# Patient Record
Sex: Male | Born: 1965 | Race: White | Hispanic: No | Marital: Married | Smoking: Never smoker
Health system: Southern US, Community
[De-identification: ages and names within clinical notes are randomized; demographics above are authoritative.]

## PROBLEM LIST (undated history)

## (undated) DIAGNOSIS — Z87442 Personal history of urinary calculi: Secondary | ICD-10-CM

---

## 2006-07-03 ENCOUNTER — Emergency Department (HOSPITAL_COMMUNITY): Admission: EM | Admit: 2006-07-03 | Discharge: 2006-07-03 | Payer: Self-pay | Admitting: Emergency Medicine

## 2009-10-26 ENCOUNTER — Emergency Department (HOSPITAL_COMMUNITY): Admission: EM | Admit: 2009-10-26 | Discharge: 2009-10-27 | Payer: Self-pay | Admitting: Emergency Medicine

## 2011-02-10 ENCOUNTER — Ambulatory Visit (HOSPITAL_COMMUNITY)
Admission: RE | Admit: 2011-02-10 | Discharge: 2011-02-10 | Disposition: A | Discharge status: Home / Self Care | Payer: BC Managed Care – PPO | Source: Ambulatory Visit | Attending: Family Medicine | Admitting: Family Medicine

## 2011-02-10 ENCOUNTER — Other Ambulatory Visit: Payer: Self-pay | Admitting: Family Medicine

## 2011-02-10 DIAGNOSIS — R1031 Right lower quadrant pain: Secondary | ICD-10-CM | POA: Insufficient documentation

## 2011-02-10 DIAGNOSIS — N200 Calculus of kidney: Secondary | ICD-10-CM

## 2012-07-13 ENCOUNTER — Ambulatory Visit (INDEPENDENT_AMBULATORY_CARE_PROVIDER_SITE_OTHER): Payer: BC Managed Care – PPO | Admitting: Nurse Practitioner

## 2012-07-13 ENCOUNTER — Encounter: Payer: Self-pay | Admitting: Nurse Practitioner

## 2012-07-13 VITALS — BP 122/78 | Temp 98.4°F | Wt 203.8 lb

## 2012-07-13 DIAGNOSIS — J321 Chronic frontal sinusitis: Secondary | ICD-10-CM

## 2012-07-13 MED ORDER — AZITHROMYCIN 250 MG PO TABS: ORAL_TABLET | ORAL | Status: AC

## 2012-07-13 NOTE — Progress Notes (Signed)
Subjective:  Presents for complaints of occasional cough and frontal area headache for the past several days. Clear nasal drainage. Slight sore throat. No ear pain. No wheezing.  Objective:   BP 122/78  Temp(Src) 98.4 F (36.9 C) (Oral)  Wt 203 lb 12.8 oz (92.443 kg) NAD. Alert, oriented. TMs clear effusion, no erythema. Pharynx erythematous with green PND noted. Neck supple mild soft nontender adenopathy. Lungs clear. Heart regular rate rhythm.

## 2012-07-13 NOTE — Assessment & Plan Note (Addendum)
Meds ordered this encounter  Medications  . azithromycin (ZITHROMAX Z-PAK) 250 MG tablet    Sig: Take 2 tablets (500 mg) on  Day 1,  followed by 1 tablet (250 mg) once daily on Days 2 through 5.    Dispense:  6 each    Refill:  0    Order Specific Question:  Supervising Provider    Answer:  Merlyn Albert [[id number]]  Return if symptoms worsen or fail to improve.

## 2013-10-21 IMAGING — CR DG ABDOMEN 1V
2 series · 2 of 2 positions shown · non-contrast
Comparison: None.

CLINICAL DATA: Kidney stone.  Right-sided flank pain.

ABDOMEN - 1 VIEW

[view not recorded (1 of 2)]
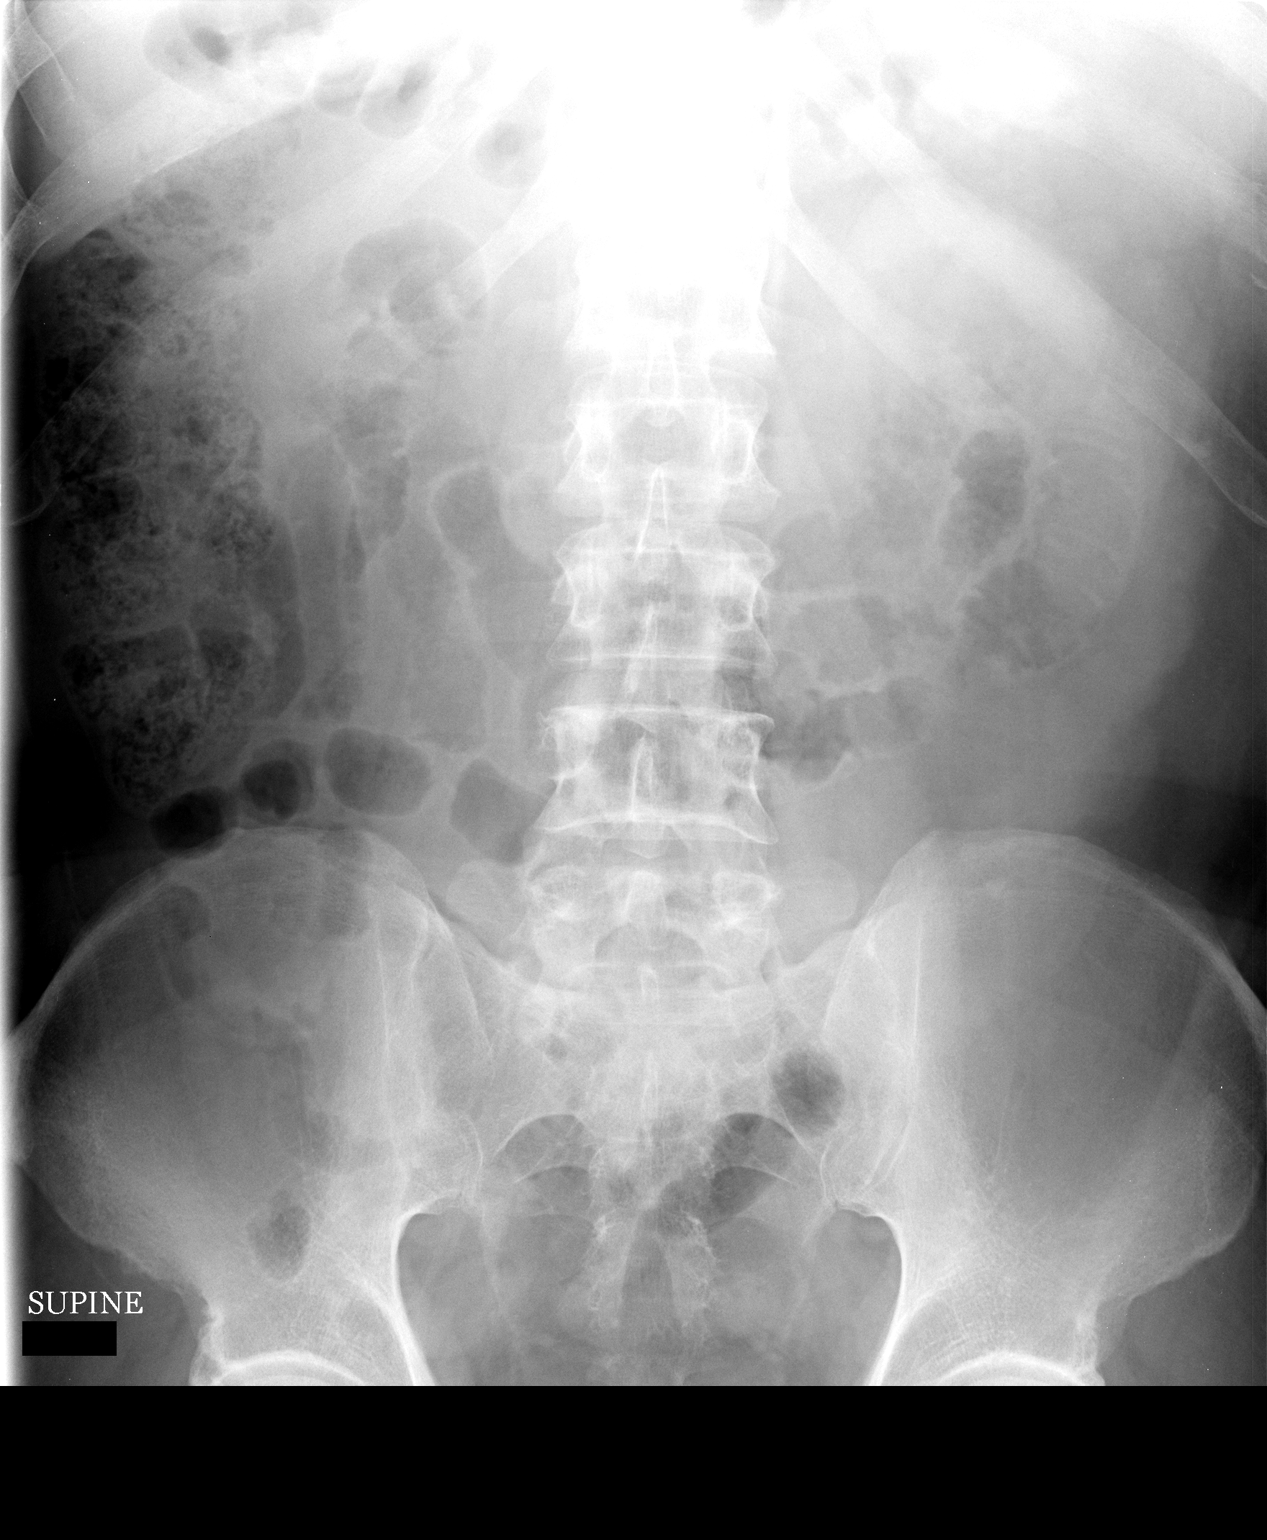

[view not recorded (2 of 2)]
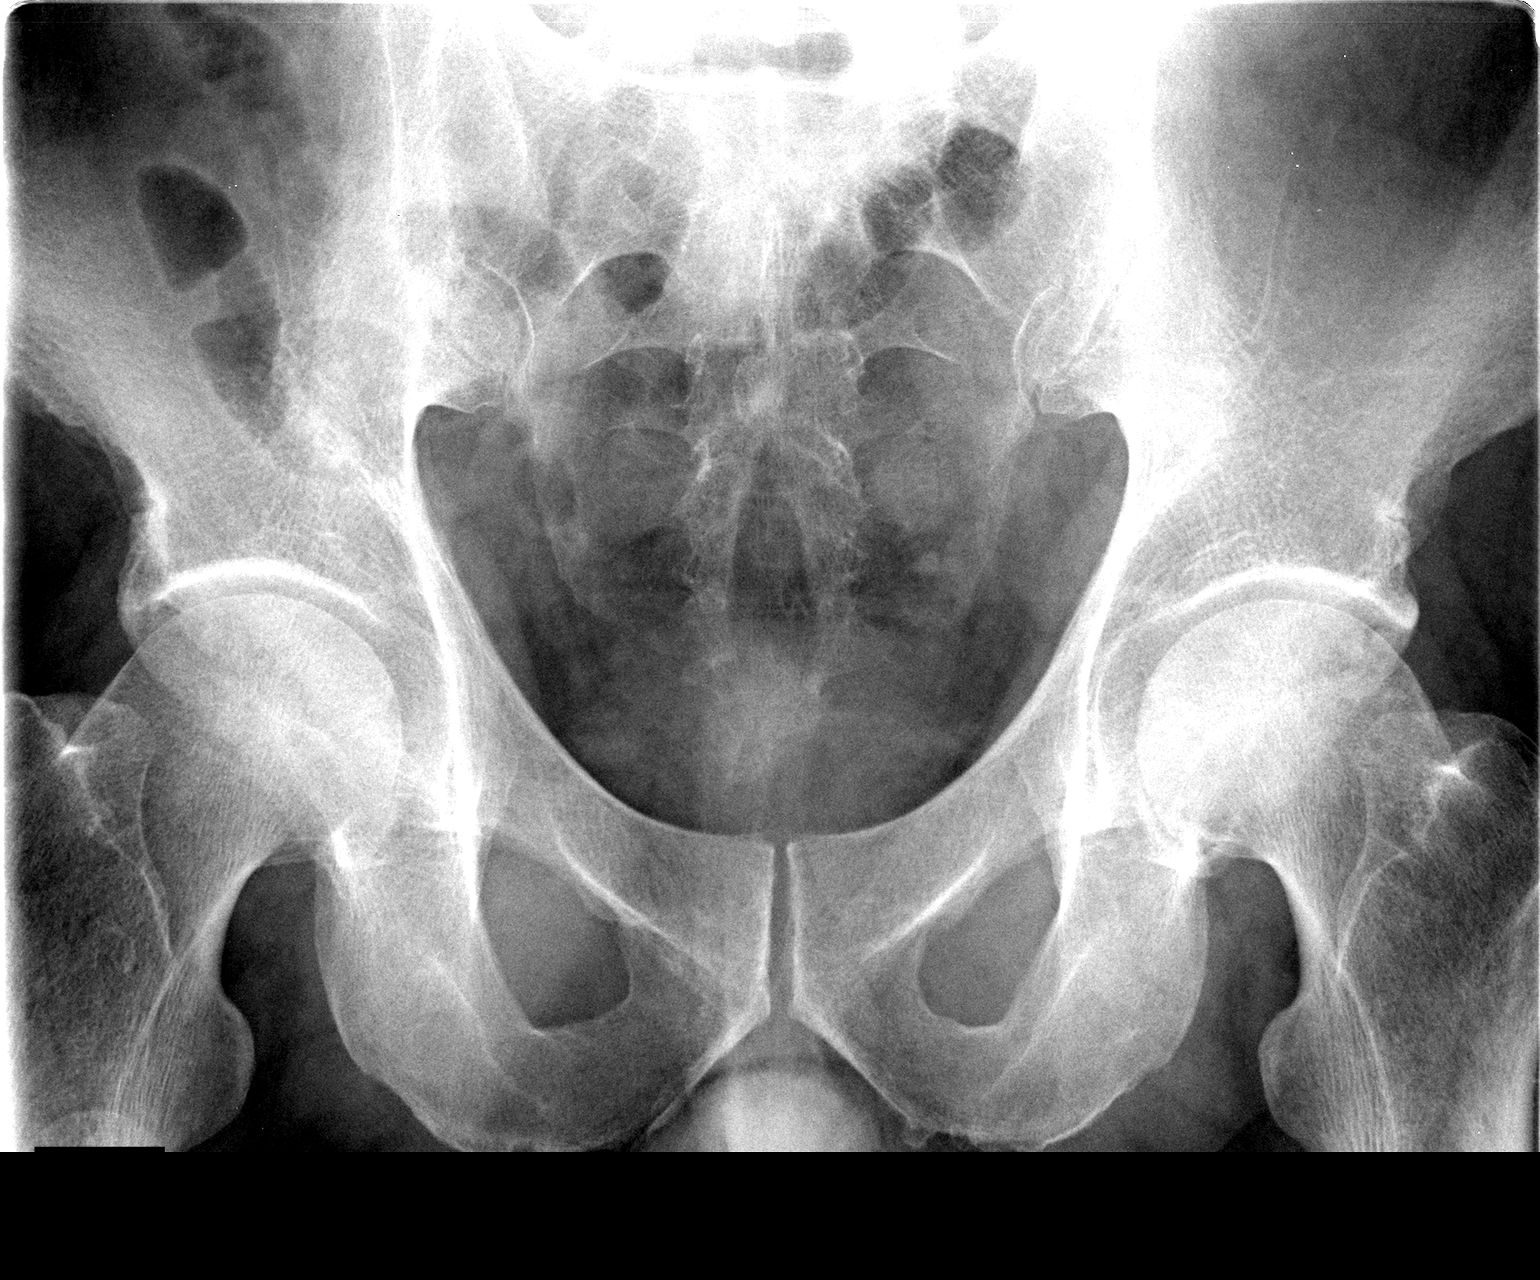

[2 of 2 positions shown; findings below may reference images not displayed]

FINDINGS: Gas and stool are seen throughout the colon.  The small
bowel gas pattern is within normal limits.  No discrete
calcifications are identified over either kidney or the expected
course of the ureters.  Small calcified stones or nonradiopaque
stones cannot be excluded.
IMPRESSION: Negative abdomen.

## 2015-09-17 ENCOUNTER — Ambulatory Visit (INDEPENDENT_AMBULATORY_CARE_PROVIDER_SITE_OTHER): Payer: 59 | Admitting: Family Medicine

## 2015-09-17 ENCOUNTER — Encounter: Payer: Self-pay | Admitting: Family Medicine

## 2015-09-17 VITALS — BP 130/80 | Temp 98.3°F | Wt 205.0 lb

## 2015-09-17 DIAGNOSIS — S8012XA Contusion of left lower leg, initial encounter: Secondary | ICD-10-CM | POA: Diagnosis not present

## 2015-09-17 DIAGNOSIS — S80819A Abrasion, unspecified lower leg, initial encounter: Secondary | ICD-10-CM | POA: Diagnosis not present

## 2015-09-17 DIAGNOSIS — W19XXXA Unspecified fall, initial encounter: Secondary | ICD-10-CM | POA: Diagnosis not present

## 2015-09-17 NOTE — Progress Notes (Signed)
   Subjective:    Patient ID: Richard Werner, male    DOB: [birth year], 50 y.o.   MRN: [id number]  Fall The accident occurred 3 to 5 days ago. He fell from an unknown height. The pain is present in the left lower leg. The pain is moderate. The symptoms are aggravated by movement. He has tried nothing for the symptoms. The treatment provided no relief.   Patient also complains of dizziness today.He denies any unilateral numbness or weakness states he feels a little dizzy when he puts his head in certain directions.   He denies any chest tightness pressure pain denies unilateral numbness or weakness  Review of Systems     Objective:   Physical Exam  The calf is nontender he does have some swelling around the area the bruising there is bruising in the back of the calf the front of the calf and the foot region there is an abrasion on the leg      Assessment & Plan:  Severe contusion and abrasion on the left lower leg. No significant cellulitis noted. I believe that this is mainly a deep bruise along with an abrasion tetanus shot indicated warning signs regarding DVT were discussed in detail  Blood pressure borderline at 130/86 healthy diet recommended if ongoing troubles with dizziness follow-up I find no evidence of stroke I recommend lab work and I recommend a wellness visit he states he's had labs done at his workplace he will bring them with him

## 2015-11-04 ENCOUNTER — Ambulatory Visit (INDEPENDENT_AMBULATORY_CARE_PROVIDER_SITE_OTHER): Payer: 59 | Admitting: Family Medicine

## 2015-11-04 ENCOUNTER — Encounter: Payer: Self-pay | Admitting: Family Medicine

## 2015-11-04 VITALS — BP 124/72 | Ht 69.0 in | Wt 209.0 lb

## 2015-11-04 DIAGNOSIS — Z0189 Encounter for other specified special examinations: Secondary | ICD-10-CM | POA: Diagnosis not present

## 2015-11-04 DIAGNOSIS — Z0000 Encounter for general adult medical examination without abnormal findings: Secondary | ICD-10-CM

## 2015-11-04 NOTE — Progress Notes (Signed)
   Subjective:    Patient ID: Richard Werner, male    DOB: 06-27-1965, [age] y.o.   MRN: [id number]  HPI The patient comes in today for a wellness visit. Pt scheduled for a follow up. Has no concerns. Last note states he needs wellness. 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   A review of their health history was completed.  A review of medications was also completed.  Any needed refills; not taking any meds  Eating habits: health conscious  Falls/  MVA accidents in past few months: none  Regular exercise: no regular routine. Active job  Specialist pt sees on regular basis: none  Preventative health issues were discussed.   Additional concerns: none  Info on colonscopy given.      Review of Systems  Constitutional: Negative for fever, activity change and appetite change.  HENT: Negative for congestion and rhinorrhea.   Eyes: Negative for discharge.  Respiratory: Negative for cough and wheezing.   Cardiovascular: Negative for chest pain.  Gastrointestinal: Negative for vomiting, abdominal pain and blood in stool.  Genitourinary: Negative for frequency and difficulty urinating.  Musculoskeletal: Negative for neck pain.  Skin: Negative for rash.  Allergic/Immunologic: Negative for environmental allergies and food allergies.  Neurological: Negative for weakness and headaches.  Psychiatric/Behavioral: Negative for agitation.       Objective:   Physical Exam  Constitutional: He appears well-developed and well-nourished.  HENT:  Head: Normocephalic and atraumatic.  Right Ear: External ear normal.  Left Ear: External ear normal.  Nose: Nose normal.  Mouth/Throat: Oropharynx is clear and moist.  Eyes: EOM are normal. Pupils are equal, round, and reactive to light.  Neck: Normal range of motion. Neck supple. No thyromegaly present.  Cardiovascular: Normal rate, regular rhythm and normal heart sounds.   No murmur heard. Pulmonary/Chest: Effort normal and breath sounds normal. No  respiratory distress. He has no wheezes.  Abdominal: Soft. Bowel sounds are normal. He exhibits no distension and no mass. There is no tenderness.  Genitourinary: Rectum normal and penis normal.  Musculoskeletal: Normal range of motion. He exhibits no edema.  Lymphadenopathy:    He has no cervical adenopathy.  Neurological: He is alert. He exhibits normal muscle tone.  Skin: Skin is warm and dry. No erythema.  Psychiatric: He has a normal mood and affect. His behavior is normal. Judgment normal.    Patient did have DOT physical but they did not cover preventative measures      Assessment & Plan:  Adult wellness-complete.wellness physical was conducted today. Importance of diet and exercise were discussed in detail. In addition to this a discussion regarding safety was also covered. We also reviewed over immunizations and gave recommendations regarding current immunization needed for age. In addition to this additional areas were also touched on including: Preventative health exams needed: Colonoscopy Advised colonoscopy he will be setting it up to help he will let us know patient overall doing well he is working hard on diet and exercise He does not smoke or drink He will get his lab work done we await the results of this he states he will get it through his workplace and send us a copy I told him it should include cholesterol as well as PSA  Patient was advised yearly wellness exam

## 2015-11-06 ENCOUNTER — Other Ambulatory Visit: Payer: Self-pay | Admitting: Family Medicine

## 2015-11-06 DIAGNOSIS — Z1211 Encounter for screening for malignant neoplasm of colon: Secondary | ICD-10-CM

## 2015-11-07 ENCOUNTER — Encounter (INDEPENDENT_AMBULATORY_CARE_PROVIDER_SITE_OTHER): Payer: Self-pay | Admitting: *Deleted

## 2016-01-07 ENCOUNTER — Other Ambulatory Visit (INDEPENDENT_AMBULATORY_CARE_PROVIDER_SITE_OTHER): Payer: Self-pay | Admitting: *Deleted

## 2016-01-07 DIAGNOSIS — Z1211 Encounter for screening for malignant neoplasm of colon: Secondary | ICD-10-CM

## 2016-02-20 ENCOUNTER — Telehealth: Payer: Self-pay | Admitting: Family Medicine

## 2016-02-20 NOTE — Telephone Encounter (Signed)
Pt dropped off some lab results that he had done at [facility]. Please see red folder in office.

## 2016-02-20 NOTE — Telephone Encounter (Signed)
I reviewed over the patient's blood work. The patient's triglycerides are moderately elevated at 372 and HDL is low at 27. His thyroid function PSA look normal. A1c normal. Kidney functions liver functions all good. Not anemic. Low-fat diet. Recommend vegetables fruits lean meats avoid fried foods minimize starches and sugars. The patient may take fish oil capsules 1 g twice daily. This will help keep his triglycerides in better findings. Regular exercise as well. Repeat cholesterol profile by early spring time. If not doing better will need to be on medications. The patient should call in February 2 request lab work and an office visit for this spring

## 2016-02-21 NOTE — Telephone Encounter (Signed)
Spoke with patient and informed him per Dr.Scott Luking-Dr.Scott reviewed over the patient's blood work.Your triglycerides are moderately elevated at 372 and HDL is low at 27. Your thyroid function PSA look normal. A1c normal. Kidney functions liver functions all good. Not anemic. Low-fat diet. Recommend vegetables fruits lean meats avoid fried foods minimize starches and sugars. You  may take fish oil capsules 1 g twice daily. This will help keep your  triglycerides in better findings. Regular exercise as well. Repeat cholesterol profile by early spring time. If not doing better will need to be on medications. You  should call in February 2 request lab work and an office visit for this spring. Patient verbalized understanding.

## 2016-02-25 ENCOUNTER — Encounter: Payer: Self-pay | Admitting: Family Medicine

## 2016-03-25 ENCOUNTER — Encounter (INDEPENDENT_AMBULATORY_CARE_PROVIDER_SITE_OTHER): Payer: Self-pay | Admitting: *Deleted

## 2016-03-25 ENCOUNTER — Telehealth (INDEPENDENT_AMBULATORY_CARE_PROVIDER_SITE_OTHER): Payer: Self-pay | Admitting: *Deleted

## 2016-03-25 MED ORDER — SUPREP BOWEL PREP KIT 17.5-3.13-1.6 GM/177ML PO SOLN: 1.0000 | Freq: Once | ORAL | 0 refills | Status: AC

## 2016-03-25 NOTE — Telephone Encounter (Signed)
Patient needs suprep 

## 2016-03-30 ENCOUNTER — Telehealth (INDEPENDENT_AMBULATORY_CARE_PROVIDER_SITE_OTHER): Payer: Self-pay | Admitting: *Deleted

## 2016-03-30 NOTE — Telephone Encounter (Signed)
Referring MD/PCP: scott luking   Procedure: tcs  Reason/Indication:  screening  Has patient had this procedure before?  no  If so, when, by whom and where?    Is there a family history of colon cancer?  no  Who?  What age when diagnosed?    Is patient diabetic?   no      Does patient have prosthetic heart valve or mechanical valve?  no  Do you have a pacemaker?  no  Has patient ever had endocarditis? no  Has patient had joint replacement within last 12 months?  no  Does patient tend to be constipated or take laxatives? no  Does patient have a history of alcohol/drug use?  no  Is patient on Coumadin, Plavix and/or Aspirin? no  Medications: none  Allergies: nkda  Medication Adjustment:   Procedure date & time: 04/29/16 at 830

## 2016-03-31 NOTE — Telephone Encounter (Signed)
agree

## 2016-04-29 ENCOUNTER — Encounter (HOSPITAL_COMMUNITY)
Admission: RE | Disposition: A | Discharge status: Home / Self Care | Payer: Self-pay | Source: Ambulatory Visit | Attending: Internal Medicine

## 2016-04-29 ENCOUNTER — Encounter (HOSPITAL_COMMUNITY): Payer: Self-pay | Admitting: *Deleted

## 2016-04-29 ENCOUNTER — Ambulatory Visit (HOSPITAL_COMMUNITY)
Admission: RE | Admit: 2016-04-29 | Discharge: 2016-04-29 | Disposition: A | Discharge status: Home / Self Care | Payer: 59 | Source: Ambulatory Visit | Attending: Internal Medicine | Admitting: Internal Medicine

## 2016-04-29 DIAGNOSIS — Z8371 Family history of colonic polyps: Secondary | ICD-10-CM | POA: Insufficient documentation

## 2016-04-29 DIAGNOSIS — K644 Residual hemorrhoidal skin tags: Secondary | ICD-10-CM | POA: Insufficient documentation

## 2016-04-29 DIAGNOSIS — K648 Other hemorrhoids: Secondary | ICD-10-CM | POA: Diagnosis not present

## 2016-04-29 DIAGNOSIS — Z1211 Encounter for screening for malignant neoplasm of colon: Secondary | ICD-10-CM | POA: Insufficient documentation

## 2016-04-29 HISTORY — PX: COLONOSCOPY: SHX5424

## 2016-04-29 HISTORY — DX: Personal history of urinary calculi: Z87.442

## 2016-04-29 SURGERY — COLONOSCOPY
Anesthesia: Moderate Sedation

## 2016-04-29 MED ORDER — SODIUM CHLORIDE 0.9 % IV SOLN
INTRAVENOUS | Status: DC
  Administered 2016-04-29: 1000 mL via INTRAVENOUS

## 2016-04-29 MED ORDER — MIDAZOLAM HCL 5 MG/5ML IJ SOLN
INTRAMUSCULAR | Status: AC
  Filled 2016-04-29: qty 10

## 2016-04-29 MED ORDER — STERILE WATER FOR IRRIGATION IR SOLN
Status: DC | PRN
  Administered 2016-04-29: 10:00:00

## 2016-04-29 MED ORDER — MEPERIDINE HCL 50 MG/ML IJ SOLN
INTRAMUSCULAR | Status: DC | PRN
  Administered 2016-04-29 (×2): 25 mg via INTRAVENOUS

## 2016-04-29 MED ORDER — MEPERIDINE HCL 50 MG/ML IJ SOLN
INTRAMUSCULAR | Status: AC
  Filled 2016-04-29: qty 1

## 2016-04-29 MED ORDER — MIDAZOLAM HCL 5 MG/5ML IJ SOLN
INTRAMUSCULAR | Status: DC | PRN
  Administered 2016-04-29 (×3): 2 mg via INTRAVENOUS

## 2016-04-29 NOTE — H&P (Signed)
Richard Werner is an 51 y.o. male.   Chief Complaint: Patient is here for colonoscopy. HPI: Patient is 51 year old Caucasian male was here for screening colonoscopy. He denies abdominal pain change in bowel habits or rectal bleeding. Family history is negative for CRC but his brother who was 643 years old has had colonic polyps.  Past Medical History:  Diagnosis Date  . History of kidney stones     History reviewed. No pertinent surgical history.  Family History  Problem Relation Age of Onset  . Diabetes Mother   . Heart Problems Mother   . Coronary artery disease Father   . Hypertension Father    Social History:  reports that he has never smoked. He has never used smokeless tobacco. He reports that he drinks alcohol. He reports that he does not use drugs.  Allergies: No Known Allergies  No prescriptions prior to admission.    No results found for this or any previous visit (from the past 48 hour(s)). No results found.  ROS  Blood pressure 115/80, pulse 72, temperature 98 F (36.7 C), temperature source Oral, resp. rate 19, height 5\' 8"  (1.727 m), weight 200 lb (90.7 kg), SpO2 99 %. Physical Exam  Constitutional: He appears well-developed and well-nourished.  HENT:  Mouth/Throat: Oropharynx is clear and moist.  Eyes: Conjunctivae are normal. No scleral icterus.  Neck: No thyromegaly present.  Cardiovascular: Normal rate, regular rhythm and normal heart sounds.   No murmur heard. Respiratory: Effort normal and breath sounds normal.  GI: Soft. He exhibits no distension and no mass. There is no tenderness.  Musculoskeletal: He exhibits no edema.  Lymphadenopathy:    He has no cervical adenopathy.  Neurological: He is alert.  Skin: Skin is warm and dry.     Assessment/Plan Average risk screening colonoscopy.  Richard DecemberNajeeb Warrene Kapfer, MD 04/29/2016, 9:46 AM

## 2016-04-29 NOTE — Op Note (Signed)
[facility] Patient Name: Richard Werner Procedure Date: 04/29/2016 9:41 AM MRN: [id number] Date of Birth: 01-09-1966 Attending MD: Lionel DecemberNajeeb Caran Storck , MD CSN: [id number] Age: [age] Admit Type: Outpatient Procedure:                Colonoscopy Indications:              Screening for colorectal malignant neoplasm Providers:                Lionel DecemberNajeeb Delta Deshmukh, MD, Loma MessingLurae B. Patsy LagerAlbert RN, RN, Birder Robsonebra                            Houghton, Technician Referring MD:             Jonna CoupScott A. Gerda DissLuking, MD Medicines:                Meperidine 50 mg IV, Midazolam 6 mg IV Complications:            No immediate complications. Estimated Blood Loss:     Estimated blood loss: none. Procedure:                Pre-Anesthesia Assessment:                           - Prior to the procedure, a History and Physical                            was performed, and patient medications and                            allergies were reviewed. The patient's tolerance of                            previous anesthesia was also reviewed. The risks                            and benefits of the procedure and the sedation                            options and risks were discussed with the patient.                            All questions were answered, and informed consent                            was obtained. Prior Anticoagulants: The patient has                            taken no previous anticoagulant or antiplatelet                            agents. ASA Grade Assessment: I - A normal, healthy                            patient. After reviewing the risks and benefits,  the patient was deemed in satisfactory condition to                            undergo the procedure.                           After obtaining informed consent, the colonoscope                            was passed under direct vision. Throughout the                            procedure, the patient's blood pressure, pulse, and          oxygen saturations were monitored continuously. The                            EC-[id number] ([id number]) scope was introduced through                            the anus and advanced to the the cecum, identified                            by appendiceal orifice and ileocecal valve. The                            colonoscopy was performed without difficulty. The                            patient tolerated the procedure well. The quality                            of the bowel preparation was good. The ileocecal                            valve, appendiceal orifice, and rectum were                            photographed. Scope In: 9:55:51 AM Scope Out: 10:06:57 AM Scope Withdrawal Time: 0 hours 8 minutes 51 seconds  Total Procedure Duration: 0 hours 11 minutes 6 seconds  Findings:      The perianal and digital rectal examinations were normal.      The colon (entire examined portion) appeared normal.      External and internal hemorrhoids were found during retroflexion. The       hemorrhoids were small. Impression:               - The entire examined colon is normal.                           - External and internal hemorrhoids.                           - No specimens collected. Moderate Sedation:      Moderate (conscious) sedation was administered by the endoscopy nurse  and supervised by the endoscopist. The following parameters were       monitored: oxygen saturation, heart rate, blood pressure, CO2       capnography and response to care. Total physician intraservice time was       17 minutes. Recommendation:           - Patient has a contact number available for                            emergencies. The signs and symptoms of potential                            delayed complications were discussed with the                            patient. Return to normal activities tomorrow.                            Written discharge instructions were provided to the                             patient.                           - Resume previous diet today.                           - Repeat colonoscopy in 10 years for screening                            purposes. Procedure Code(s):        --- Professional ---                           [phone], Colonoscopy, flexible; diagnostic, including                            collection of specimen(s) by brushing or washing,                            when performed (separate procedure)                           99152, Moderate sedation services provided by the                            same physician or other qualified health care                            professional performing the diagnostic or                            therapeutic service that the sedation supports,                            requiring the presence of an independent trained  observer to assist in the monitoring of the                            patient's level of consciousness and physiological                            status; initial 15 minutes of intraservice time,                            patient age 48 years or older Diagnosis Code(s):        --- Professional ---                           Z12.11, Encounter for screening for malignant                            neoplasm of colon                           K64.8, Other hemorrhoids CPT copyright 2016 American Medical Association. All rights reserved. The codes documented in this report are preliminary and upon coder review may  be revised to meet current compliance requirements. Lionel December, MD Lionel December, MD 04/29/2016 10:12:24 AM This report has been signed electronically. Number of Addenda: 0

## 2016-04-29 NOTE — Discharge Instructions (Signed)
Colonoscopy, Adult, Care After This sheet gives you information about how to care for yourself after your procedure. Your health care provider may also give you more specific instructions. If you have problems or questions, contact your health care provider. What can I expect after the procedure? After the procedure, it is common to have:  A small amount of blood in your stool for 24 hours after the procedure.  Some gas.  Mild abdominal cramping or bloating. Follow these instructions at home: General instructions  For the first 24 hours after the procedure:  Do not drive or use machinery.  Do not sign important documents.  Do not drink alcohol.  Do your regular daily activities at a slower pace than normal.  Eat soft, easy-to-digest foods.  Rest often.  Take over-the-counter or prescription medicines only as told by your health care provider.  It is up to you to get the results of your procedure. Ask your health care provider, or the department performing the procedure, when your results will be ready. Relieving cramping and bloating  Try walking around when you have cramps or feel bloated.  Apply heat to your abdomen as told by your health care provider. Use a heat source that your health care provider recommends, such as a moist heat pack or a heating pad.  Place a towel between your skin and the heat source.  Leave the heat on for 20-30 minutes.  Remove the heat if your skin turns bright red. This is especially important if you are unable to feel pain, heat, or cold. You may have a greater risk of getting burned. Eating and drinking  Drink enough fluid to keep your urine clear or pale yellow.  Resume your normal diet as instructed by your health care provider. Avoid heavy or fried foods that are hard to digest.  Avoid drinking alcohol for as long as instructed by your health care provider. Contact a health care provider if:  You have blood in your stool 2-3 days  after the procedure. Get help right away if:  You have more than a small spotting of blood in your stool.  You pass large blood clots in your stool.  Your abdomen is swollen.  You have nausea or vomiting.  You have a fever.  You have increasing abdominal pain that is not relieved with medicine. This information is not intended to replace advice given to you by your health care provider. Make sure you discuss any questions you have with your health care provider. Document Released: 11/19/2003 Document Revised: 12/30/2015 Document Reviewed: 06/18/2015 Elsevier Interactive Patient Education  2017 [employer] Resume usual diet. No driving for 24 hours. Next screening exam in 10 years.

## 2016-04-30 ENCOUNTER — Encounter (HOSPITAL_COMMUNITY): Payer: Self-pay | Admitting: Internal Medicine

## 2018-03-07 ENCOUNTER — Ambulatory Visit (INDEPENDENT_AMBULATORY_CARE_PROVIDER_SITE_OTHER): Payer: 59 | Admitting: *Deleted

## 2018-03-07 DIAGNOSIS — Z23 Encounter for immunization: Secondary | ICD-10-CM | POA: Diagnosis not present

## 2018-05-13 DIAGNOSIS — L249 Irritant contact dermatitis, unspecified cause: Secondary | ICD-10-CM | POA: Diagnosis not present

## 2018-05-13 DIAGNOSIS — L821 Other seborrheic keratosis: Secondary | ICD-10-CM | POA: Diagnosis not present

## 2018-05-13 DIAGNOSIS — D1801 Hemangioma of skin and subcutaneous tissue: Secondary | ICD-10-CM | POA: Diagnosis not present

## 2018-06-12 ENCOUNTER — Encounter (HOSPITAL_COMMUNITY): Payer: Self-pay | Admitting: Emergency Medicine

## 2018-06-12 ENCOUNTER — Emergency Department (HOSPITAL_COMMUNITY): Payer: BLUE CROSS/BLUE SHIELD

## 2018-06-12 ENCOUNTER — Other Ambulatory Visit: Payer: Self-pay

## 2018-06-12 ENCOUNTER — Emergency Department (HOSPITAL_COMMUNITY)
Admission: EM | Admit: 2018-06-12 | Discharge: 2018-06-12 | Disposition: A | Discharge status: Home / Self Care | Payer: BLUE CROSS/BLUE SHIELD | Attending: Emergency Medicine | Admitting: Emergency Medicine

## 2018-06-12 DIAGNOSIS — Y929 Unspecified place or not applicable: Secondary | ICD-10-CM | POA: Insufficient documentation

## 2018-06-12 DIAGNOSIS — W230XXA Caught, crushed, jammed, or pinched between moving objects, initial encounter: Secondary | ICD-10-CM | POA: Diagnosis not present

## 2018-06-12 DIAGNOSIS — S6992XA Unspecified injury of left wrist, hand and finger(s), initial encounter: Secondary | ICD-10-CM | POA: Diagnosis not present

## 2018-06-12 DIAGNOSIS — Y999 Unspecified external cause status: Secondary | ICD-10-CM | POA: Diagnosis not present

## 2018-06-12 DIAGNOSIS — S62639B Displaced fracture of distal phalanx of unspecified finger, initial encounter for open fracture: Secondary | ICD-10-CM

## 2018-06-12 DIAGNOSIS — Y939 Activity, unspecified: Secondary | ICD-10-CM | POA: Diagnosis not present

## 2018-06-12 DIAGNOSIS — S62631B Displaced fracture of distal phalanx of left index finger, initial encounter for open fracture: Secondary | ICD-10-CM | POA: Insufficient documentation

## 2018-06-12 DIAGNOSIS — S61321A Laceration with foreign body of left index finger with damage to nail, initial encounter: Secondary | ICD-10-CM | POA: Diagnosis not present

## 2018-06-12 DIAGNOSIS — Z23 Encounter for immunization: Secondary | ICD-10-CM | POA: Diagnosis not present

## 2018-06-12 DIAGNOSIS — S61211A Laceration without foreign body of left index finger without damage to nail, initial encounter: Secondary | ICD-10-CM | POA: Insufficient documentation

## 2018-06-12 DIAGNOSIS — S41112A Laceration without foreign body of left upper arm, initial encounter: Secondary | ICD-10-CM

## 2018-06-12 MED ORDER — HYDROCODONE-ACETAMINOPHEN 5-325 MG PO TABS: 1.0000 | ORAL_TABLET | Freq: Four times a day (QID) | ORAL | 0 refills | Status: DC | PRN

## 2018-06-12 MED ORDER — LIDOCAINE HCL 2 % IJ SOLN
20.0000 mL | Freq: Once | INTRAMUSCULAR | Status: AC
  Administered 2018-06-12: 400 mg
  Filled 2018-06-12: qty 20

## 2018-06-12 MED ORDER — CEPHALEXIN 500 MG PO CAPS: 500.0000 mg | ORAL_CAPSULE | Freq: Three times a day (TID) | ORAL | 0 refills | Status: DC

## 2018-06-12 MED ORDER — TETANUS-DIPHTH-ACELL PERTUSSIS 5-2.5-18.5 LF-MCG/0.5 IM SUSP
0.5000 mL | Freq: Once | INTRAMUSCULAR | Status: AC
  Administered 2018-06-12: 0.5 mL via INTRAMUSCULAR
  Filled 2018-06-12: qty 0.5

## 2018-06-12 NOTE — ED Triage Notes (Signed)
Onset today patient's toolbox door hit left index finger tip. Bleeding bright red with bandage prior to arrival. Pain 6/10 achy sore sharp. Nail is intact at this time.

## 2018-06-12 NOTE — ED Notes (Signed)
PA at bedside.

## 2018-06-12 NOTE — Progress Notes (Signed)
Reviewed clinical case, media pictures, discussed with ER provider.  Patient with a tuft fracture, very distal nailbed injury, recommended removal of the nail, irrigation debridement, it does not look like there is a nailbed repair to be achieved because the injury is so distal, but loose reapproximation with suture, potentially using chromic, and follow-up with me early next week, Xeroform, nonstick gauze, AlumaFoam splint.  Oral antibiotics.  Eulas Post, MD

## 2018-06-12 NOTE — Discharge Instructions (Signed)
It was my pleasure taking care of you today!  Please take all of your antibiotics until finished!  This is to prevent infection.  Call the orthopedic doctors office first thing in the morning to schedule a follow-up appointment.  Ibuprofen as needed for mild pain.  The pain medication I sent to your pharmacy can be taken only as needed for severe pain - This can make you very drowsy - please do not drink alcohol, operate heavy machinery or drive on this medication  Return to the emergency department for new or worsening symptoms, any additional concerns.

## 2018-06-12 NOTE — ED Notes (Signed)
Patient tolerated dressing without incident. Used Xeroform, 2x2 gauze, gauze wrap.

## 2018-06-12 NOTE — ED Notes (Signed)
Spoke to provider regarding blood pressure stated for patient to follow up with primary doctor. Patient verbalized understanding.

## 2018-06-12 NOTE — ED Provider Notes (Signed)
[facility] EMERGENCY DEPARTMENT Provider Note   CSN: [id number] Arrival date & time: 06/12/18  7014    History   Chief Complaint Chief Complaint  Patient presents with  . Finger Injury    HPI Richard Werner is a 53 y.o. male.     The history is provided by the patient and medical records. No language interpreter was used.   Richard Werner is a 53 y.o. male who presents to the Emergency Department complaining of injury to left index finger just prior to arrival.  Patient shot a heavy door and smashed his finger.  He wrapped it up with a cloth, but no other medications or treatments prior to arrival.  He did attempt to go to an urgent care, but they immediately referred him to the ER without being seen.  Tetanus not up-to-date.  No numbness or weakness.   Past Medical History:  Diagnosis Date  . History of kidney stones     Patient Active Problem List   Diagnosis Date Noted  . Special screening for malignant neoplasms, colon 01/07/2016  . Frontal sinusitis 07/13/2012    Past Surgical History:  Procedure Laterality Date  . COLONOSCOPY N/A 04/29/2016   Procedure: COLONOSCOPY;  Surgeon: Malissa Hippo, MD;  Location: [facility];  Service: Endoscopy;  Laterality: N/A;  930        Home Medications    Prior to Admission medications   Not on File    Family History Family History  Problem Relation Age of Onset  . Diabetes Mother   . Heart Problems Mother   . Coronary artery disease Father   . Hypertension Father     Social History Social History   Tobacco Use  . Smoking status: Never Smoker  . Smokeless tobacco: Never Used  Substance Use Topics  . Alcohol use: Yes    Comment: 2-3 drinks per weekend  . Drug use: No     Allergies   Patient has no known allergies.   Review of Systems Review of Systems  Musculoskeletal: Positive for arthralgias and myalgias.  Skin: Positive for wound.  Neurological: Negative for weakness and numbness.      Physical Exam Updated Vital Signs BP (!) 176/124 (BP Location: Right Arm)   Pulse 91   Temp 98.3 F (36.8 C) (Oral)   Resp 18   Ht 5\' 10"  (1.778 m)   Wt 90.7 kg   SpO2 98%   BMI 28.70 kg/m   Physical Exam Vitals signs and nursing note reviewed.  Constitutional:      General: He is not in acute distress.    Appearance: He is well-developed.  HENT:     Head: Normocephalic and atraumatic.  Neck:     Musculoskeletal: Neck supple.  Cardiovascular:     Rate and Rhythm: Normal rate and regular rhythm.     Heart sounds: Normal heart sounds. No murmur.  Pulmonary:     Effort: Pulmonary effort is normal. No respiratory distress.     Breath sounds: Normal breath sounds.  Musculoskeletal:     Comments: See image below.  Full range of motion of the left hand with good grip strength.  Sensation intact.  2+ radial pulse.  Skin:    General: Skin is warm and dry.  Neurological:     Mental Status: He is alert and oriented to person, place, and time.            ED Treatments / Results  Labs (  all labs ordered are listed, but only abnormal results are displayed) Labs Reviewed - No data to display  EKG None  Radiology Dg Finger Index Left  Result Date: 06/12/2018 CLINICAL DATA:  Crush injury left index finger in a car door today. Initial encounter. EXAM: LEFT INDEX FINGER 2+V COMPARISON:  None. FINDINGS: Comminuted fracture of the distal phalanx left index finger is identified. The fracture extends from the proximal metaphysis through the tuft. Fracture fragment off the tuft is bulky displaced approximately 0.3 cm. The articular surface of the distal phalanx is not disrupted. Soft tissue swelling is present about the fracture. Skin irregularity suggests laceration. IMPRESSION: Acute fracture distal phalanx left index fingers above. Electronically Signed   By: Drusilla Kanner M.D.   On: 06/12/2018 11:17    Procedures .Marland KitchenLaceration Repair Date/Time: 06/12/2018 2:29  PM Performed by: Jas Betten, Chase Picket, PA-C Authorized by: Keauna Brasel, Chase Picket, PA-C   Consent:    Consent obtained:  Verbal   Consent given by:  Patient   Risks discussed:  Pain, infection, poor cosmetic result and poor wound healing Anesthesia (see MAR for exact dosages):    Anesthesia method:  Nerve block   Block needle gauge:  25 G   Block anesthetic:  Lidocaine 2% w/o epi   Block technique:  Digital block   Block injection procedure:  Anatomic landmarks identified, introduced needle and negative aspiration for blood   Block outcome:  Anesthesia achieved Laceration details:    Location:  Finger   Finger location:  L index finger   Length (cm):  4 Repair type:    Repair type:  Simple Pre-procedure details:    Preparation:  Patient was prepped and draped in usual sterile fashion and imaging obtained to evaluate for foreign bodies Exploration:    Hemostasis achieved with:  Direct pressure   Wound exploration: wound explored through full range of motion and entire depth of wound probed and visualized   Treatment:    Area cleansed with:  Saline   Amount of cleaning:  Standard   Irrigation solution:  Sterile saline Skin repair:    Repair method:  Sutures   Suture size:  5-0   Wound skin closure material used: Vicryl Rapide.   Suture technique:  Simple interrupted   Number of sutures:  4 Approximation:    Approximation:  Loose Post-procedure details:    Patient tolerance of procedure:  Tolerated well, no immediate complications .Nail Removal Date/Time: 06/12/2018 2:30 PM Performed by: Chenell Lozon, Chase Picket, PA-C Authorized by: Adrie Picking, Chase Picket, PA-C   Consent:    Consent obtained:  Verbal   Consent given by:  Patient   Risks discussed:  Bleeding, incomplete removal, infection, pain and permanent nail deformity Location:    Hand:  L index finger Pre-procedure details:    Skin preparation:  Betadine Anesthesia (see MAR for exact dosages):    Anesthesia method: Digital  block - already documented in laceration repair note.  Nail Removal:    Nail removed:  Complete   Nail bed repaired: yes     Nail bed repair material:  6-0 chromic gut   Number of sutures:  1 Post-procedure details:    Dressing:  Xeroform gauze   Patient tolerance of procedure:  Tolerated well, no immediate complications .Marland KitchenLaceration Repair Date/Time: 06/12/2018 2:31 PM Performed by: Karon Heckendorn, Chase Picket, PA-C Authorized by: Candi Profit, Chase Picket, PA-C   Consent:    Consent obtained:  Verbal   Consent given by:  Patient   Risks discussed:  Pain, infection, poor cosmetic result and poor wound healing Laceration details:    Location:  Finger   Finger location:  L index finger (Nailbed) Repair type:    Repair type:  Simple Pre-procedure details:    Preparation:  Patient was prepped and draped in usual sterile fashion Skin repair:    Repair method:  Sutures   Suture size:  6-0   Suture material:  Chromic gut   Suture technique:  Simple interrupted   Number of sutures:  1 Approximation:    Approximation:  Close Post-procedure details:    Patient tolerance of procedure:  Tolerated well, no immediate complications   (including critical care time)  Medications Ordered in ED Medications  Tdap (BOOSTRIX) injection 0.5 mL (0.5 mLs Intramuscular Given 06/12/18 0949)  lidocaine (XYLOCAINE) 2 % (with pres) injection 400 mg (400 mg Infiltration Given by Other 06/12/18 0949)     Initial Impression / Assessment and Plan / ED Course  I have reviewed the triage vital signs and the nursing notes.  Pertinent labs & imaging results that were available during my care of the patient were reviewed by me and considered in my medical decision making (see chart for details).       Richard Werner is a 53 y.o. male who presents to ED for injury to his left index finger which occurred just prior to arrival.  On exam, patient has significant laceration of the index finger tip.  Appears neurovascularly  intact.  X-ray shows acute comminuted tuft fracture.  Loosely stabilized the laceration, however it does not come together very well and due to the amount of swelling that may continue to increase, and concerned to attempt to close this any tighter.  Spoke with hand surgery, Dr. Dion Saucier, who recommends removal of the nailbed, sterile dressing and hand follow up.  Nail was removed and small laceration of the nailbed seen.  1 chromic suture placed.  Xeroform placed to cover the nailbed and sterile dressing applied.  Rx for Keflex for prophylaxis of open fracture.  Referral to hand surgery provided.  Patient understands the importance to call them in the morning to schedule follow-up appointment.  Reasons to return to the emergency department were discussed and all questions answered.  Patient seen by and discussed with Dr. Donnald Garre who agrees with treatment plan.    Final Clinical Impressions(s) / ED Diagnoses   Final diagnoses:  Laceration of arm, left, complicated    ED Discharge Orders    None       Johnanna Bakke, Chase Picket, PA-C 06/12/18 1437    Arby Barrette, MD 06/22/18 1201

## 2018-06-13 DIAGNOSIS — S62601A Fracture of unspecified phalanx of left index finger, initial encounter for closed fracture: Secondary | ICD-10-CM | POA: Diagnosis not present

## 2018-06-27 DIAGNOSIS — S62601D Fracture of unspecified phalanx of left index finger, subsequent encounter for fracture with routine healing: Secondary | ICD-10-CM | POA: Diagnosis not present

## 2018-08-26 DIAGNOSIS — D1801 Hemangioma of skin and subcutaneous tissue: Secondary | ICD-10-CM | POA: Diagnosis not present

## 2018-08-26 DIAGNOSIS — L821 Other seborrheic keratosis: Secondary | ICD-10-CM | POA: Diagnosis not present

## 2019-02-15 ENCOUNTER — Telehealth: Payer: Self-pay | Admitting: Family Medicine

## 2019-02-15 NOTE — Telephone Encounter (Signed)
Patient states his coworkers son passed away and he went up to him Monday am and was about 3 ft away to give his condolences after a few secs they realized they didnt have mask on and back up about 10 ft and finished talking for abot 5 minutes with no mask. Patient states he just found out the coworker has covid. The patient is having no sx

## 2019-02-15 NOTE — Telephone Encounter (Signed)
Patient notified per Dr Nicki Reaper: Under the guidelines  he does not have to go into quarantine Guidelines state that if a person has been exposed for 10 to 15 minutes in close quarters Defined as a situation where the person with Covid and the person in their presence was not wearing a mask So therefore just a few seconds close by would be unlikely to cause illness  Best approach moving forward is wear a mask when around individuals regardless of how well you think the other individuals are  Out of an abundance of caution if Richard Werner can do a Covid test The likelihood of it being positive is low  As is with any potential exposure symptoms may not begin for up to 14 days so therefore if a person does not test and it is negative but a week later have body aches fever congestion cough sore throat etc. it is recommended to do Covid testing at that point and to withdraw from activities until evaluation  Patient verbalized understanding.

## 2019-02-15 NOTE — Telephone Encounter (Signed)
Pt was exposed to a co-worker Monday that has tested positive for Covid  Wonders what he should do, please advise

## 2019-02-15 NOTE — Telephone Encounter (Signed)
Under the guidelines that I am aware of he does not have to go into quarantine Guidelines state that if a person has been exposed for 10 to 15 minutes in close quarters Defined as a situation where the person with Covid and the person in their presence was not wearing a mask So therefore just a few seconds close by would be unlikely to cause illness  Best approach moving forward is wear a mask when around individuals regardless of how well you think the other individuals are  Out of an abundance of caution if Richard Werner can do a Covid test The likelihood of it being positive is low  As is with any potential exposure symptoms may not begin for up to 14 days so therefore if a person does not test and it is negative but a week later have body aches fever congestion cough sore throat etc. it is recommended to do Covid testing at that point and to withdraw from activities until evaluation

## 2019-02-15 NOTE — Telephone Encounter (Signed)
Nurses It does not matter if the person who tested positive was close to the patient or not Also it matters were they wearing masks? What was the setting for the exposure?  How long where they around the individual who tested positive (briefly like 20 seconds?  Or like 15 minutes?  Or longer?)   If the patient was in close proximity to the person who tested positive for a significant length of time it is recommended to be in self quarantine for 2 weeks and to consider testing although it should be noted that this virus when we are exposed to it can show up as an infection anywhere between 2 and 14 days after the exposure   Feel free when she have more information to communicate back

## 2019-03-23 ENCOUNTER — Other Ambulatory Visit: Payer: Self-pay

## 2019-03-23 ENCOUNTER — Ambulatory Visit (INDEPENDENT_AMBULATORY_CARE_PROVIDER_SITE_OTHER): Payer: BC Managed Care – PPO | Admitting: Family Medicine

## 2019-03-23 DIAGNOSIS — Z20822 Contact with and (suspected) exposure to covid-19: Secondary | ICD-10-CM

## 2019-03-23 DIAGNOSIS — J019 Acute sinusitis, unspecified: Secondary | ICD-10-CM

## 2019-03-23 MED ORDER — AMOXICILLIN-POT CLAVULANATE 875-125 MG PO TABS: 1.0000 | ORAL_TABLET | Freq: Two times a day (BID) | ORAL | 0 refills | Status: DC

## 2019-03-23 NOTE — Progress Notes (Signed)
   Subjective:    Patient ID: Richard Werner, male    DOB: 04-29-1965, 53 y.o.   MRN: [id number]  Sinusitis This is a new problem. Episode onset: 4 days. Associated symptoms include congestion, coughing, headaches and a sore throat. Pertinent negatives include no chills or ear pain. Treatments tried: allegra.  Patient with significant respiratory symptoms of head congestion drainage sinus stuffiness symptoms over the past few days he is trying to be very careful about not getting Covid denies high fever chills difficulty breathing PMH benign Virtual Visit via Video Note  I connected with Richard Werner on 03/23/19 at  9:00 AM EST by a video enabled telemedicine application and verified that I am speaking with the correct person using two identifiers.  Location: Patient: home Provider: office   I discussed the limitations of evaluation and management by telemedicine and the availability of in person appointments. The patient expressed understanding and agreed to proceed.  History of Present Illness:    Observations/Objective:   Assessment and Plan:   Follow Up Instructions:    I discussed the assessment and treatment plan with the patient. The patient was provided an opportunity to ask questions and all were answered. The patient agreed with the plan and demonstrated an understanding of the instructions.   The patient was advised to call back or seek an in-person evaluation if the symptoms worsen or if the condition fails to improve as anticipated.  I provided 16 minutes of non-face-to-face time during this encounter.      Review of Systems  Constitutional: Negative for activity change, chills and fever.  HENT: Positive for congestion, rhinorrhea and sore throat. Negative for ear pain.   Eyes: Negative for discharge.  Respiratory: Positive for cough. Negative for wheezing.   Cardiovascular: Negative for chest pain.  Gastrointestinal: Negative for nausea and vomiting.   Musculoskeletal: Negative for arthralgias.  Neurological: Positive for headaches.       Objective:   Physical Exam  Patient had virtual visit Appears to be in no distress Atraumatic Neuro able to relate and oriented No apparent resp distress Color normal       Assessment & Plan:  Patient does not appear toxic but this could well be Covid It is also possible could be a virus with secondary sinus Antibiotic sent in Warning signs regarding Covid and shortness of breath discussed Patient to go for Covid testing Follow-up if progressive troubles

## 2019-03-26 ENCOUNTER — Telehealth: Payer: Self-pay | Admitting: Family Medicine

## 2019-03-26 LAB — NOVEL CORONAVIRUS, NAA: SARS-CoV-2, NAA: DETECTED — AB

## 2019-03-26 NOTE — Telephone Encounter (Signed)
°  Pt called for covid results.  Please call pt at [phone] Thank you

## 2019-07-31 ENCOUNTER — Telehealth: Payer: Self-pay | Admitting: Family Medicine

## 2019-07-31 NOTE — Telephone Encounter (Signed)
Patient went for his second shot this am and received it and when he returned to work his boss told them he had tested positive for covid (boss had slight cough) Louann Sjogren has also had both covid vaccines for over a month. Louann Sjogren is being sent for another send off covid test to confirm positive results or see if false negative. Patient has no symptoms and has been around boss with no mask but never closer than 6 ft to boss- he works at [employer] in [employer]

## 2019-07-31 NOTE — Telephone Encounter (Signed)
Patient notified and verbalized understanding. 

## 2019-07-31 NOTE — Telephone Encounter (Signed)
Patient is went to get 2nd Covid he left work and returned to find out co-worker tested positive for [employer]. He has no symptoms but wanted to know what to do.Please advise

## 2019-07-31 NOTE — Telephone Encounter (Signed)
This raises multiple questions #1 did the patient go ahead and get his second shot? #2 was the patient exposed to the coworker without mask and without social distance for more than 15 minutes in a given day if so when? #3 where does he work and what has his workplace had to say about this #4 is patient-Sam-feeling any sickness symptoms?

## 2019-07-31 NOTE — Telephone Encounter (Signed)
If the patient is symptom-free he does not need to do a test today but he should consider doing a Covid test within 5 to 7 days-I would not recommend the rapid test I would recommend a PCR test this can be done through [facility] if he schedules accordingly  Certainly if he starts running symptoms he should self isolate And if his bosses confirmatory test comes back positive the boss should remove himself from work for at least 10 days but that is up to the boss and his provider

## 2019-09-11 ENCOUNTER — Ambulatory Visit (INDEPENDENT_AMBULATORY_CARE_PROVIDER_SITE_OTHER): Payer: BC Managed Care – PPO | Admitting: Family Medicine

## 2019-09-11 ENCOUNTER — Other Ambulatory Visit: Payer: Self-pay

## 2019-09-11 ENCOUNTER — Encounter: Payer: Self-pay | Admitting: Family Medicine

## 2019-09-11 VITALS — BP 120/72 | Temp 97.6°F | Ht 70.0 in | Wt 196.0 lb

## 2019-09-11 DIAGNOSIS — Z0000 Encounter for general adult medical examination without abnormal findings: Secondary | ICD-10-CM

## 2019-09-11 DIAGNOSIS — Z79899 Other long term (current) drug therapy: Secondary | ICD-10-CM

## 2019-09-11 DIAGNOSIS — Z13220 Encounter for screening for lipoid disorders: Secondary | ICD-10-CM

## 2019-09-11 DIAGNOSIS — Z125 Encounter for screening for malignant neoplasm of prostate: Secondary | ICD-10-CM

## 2019-09-11 NOTE — Progress Notes (Signed)
Subjective:    Patient ID: Richard Werner, male    DOB: 14-May-1965, 54 y.o.   MRN: [id number]  HPI The patient comes in today for a wellness visit.  Patient does try to be healthy in his eating habits tries to stay physically active plans on getting more walking in in the near future  A review of their health history was completed.  A review of medications was also completed.  Any needed refills; none  Eating habits: eats healthy  Falls/  MVA accidents in past few months: none  Regular exercise: unable to exercise as much as he would like due to working to much  Specialist pt sees on regular basis: none  Preventative health issues were discussed.   Additional concerns: none  Review of Systems  Constitutional: Negative for activity change, appetite change and fever.  HENT: Negative for congestion and rhinorrhea.   Eyes: Negative for discharge.  Respiratory: Negative for cough and wheezing.   Cardiovascular: Negative for chest pain.  Gastrointestinal: Negative for abdominal pain, blood in stool and vomiting.  Genitourinary: Negative for difficulty urinating and frequency.  Musculoskeletal: Negative for neck pain.  Skin: Negative for rash.  Allergic/Immunologic: Negative for environmental allergies and food allergies.  Neurological: Negative for weakness and headaches.  Psychiatric/Behavioral: Negative for agitation.       Objective:   Physical Exam Constitutional:      Appearance: He is well-developed.  HENT:     Head: Normocephalic and atraumatic.     Right Ear: External ear normal.     Left Ear: External ear normal.     Nose: Nose normal.  Eyes:     Pupils: Pupils are equal, round, and reactive to light.  Neck:     Thyroid: No thyromegaly.  Cardiovascular:     Rate and Rhythm: Normal rate and regular rhythm.     Heart sounds: Normal heart sounds. No murmur.  Pulmonary:     Effort: Pulmonary effort is normal. No respiratory distress.     Breath sounds: Normal  breath sounds. No wheezing.  Abdominal:     General: Bowel sounds are normal. There is no distension.     Palpations: Abdomen is soft. There is no mass.     Tenderness: There is no abdominal tenderness.  Genitourinary:    Penis: Normal.   Musculoskeletal:        General: Normal range of motion.     Cervical back: Normal range of motion and neck supple.  Lymphadenopathy:     Cervical: No cervical adenopathy.  Skin:    General: Skin is warm and dry.     Findings: No erythema.  Neurological:     Mental Status: He is alert.     Motor: No abnormal muscle tone.  Psychiatric:        Behavior: Behavior normal.        Judgment: Judgment normal.     Patient does not smoke patient does not drink He eats very healthy with a lot of vegetables Drinks water and coffee Very active at work Is trying to fit in some walking Tries to be very safe and everything he does Denies any setbacks or health issues currently Prostate exam normal     Assessment & Plan:  Adult wellness-complete.wellness physical was conducted today. Importance of diet and exercise were discussed in detail.  In addition to this a discussion regarding safety was also covered. We also reviewed over immunizations and gave recommendations regarding current immunization needed for  age.  In addition to this additional areas were also touched on including: Preventative health exams needed:  Colonoscopy 2018  Patient was advised yearly wellness exam

## 2019-09-14 DIAGNOSIS — Z13220 Encounter for screening for lipoid disorders: Secondary | ICD-10-CM | POA: Diagnosis not present

## 2019-09-14 DIAGNOSIS — Z79899 Other long term (current) drug therapy: Secondary | ICD-10-CM | POA: Diagnosis not present

## 2019-09-14 DIAGNOSIS — Z125 Encounter for screening for malignant neoplasm of prostate: Secondary | ICD-10-CM | POA: Diagnosis not present

## 2019-09-14 DIAGNOSIS — Z0000 Encounter for general adult medical examination without abnormal findings: Secondary | ICD-10-CM | POA: Diagnosis not present

## 2019-09-15 ENCOUNTER — Encounter: Payer: Self-pay | Admitting: Family Medicine

## 2019-09-15 LAB — HEPATIC FUNCTION PANEL
ALT: 21 IU/L (ref 0–44)
AST: 18 IU/L (ref 0–40)
Albumin: 4.5 g/dL (ref 3.8–4.9)
Alkaline Phosphatase: 96 IU/L (ref 48–121)
Bilirubin Total: 0.5 mg/dL (ref 0.0–1.2)
Bilirubin, Direct: 0.11 mg/dL (ref 0.00–0.40)
Total Protein: 6.7 g/dL (ref 6.0–8.5)

## 2019-09-15 LAB — BASIC METABOLIC PANEL
BUN/Creatinine Ratio: 15 (ref 9–20)
BUN: 16 mg/dL (ref 6–24)
CO2: 25 mmol/L (ref 20–29)
Calcium: 9.7 mg/dL (ref 8.7–10.2)
Chloride: 104 mmol/L (ref 96–106)
Creatinine, Ser: 1.04 mg/dL (ref 0.76–1.27)
GFR calc Af Amer: 94 mL/min/{1.73_m2} (ref >59)
GFR calc non Af Amer: 81 mL/min/{1.73_m2} (ref >59)
Glucose: 100 mg/dL — ABNORMAL HIGH (ref 65–99)
Potassium: 4.6 mmol/L (ref 3.5–5.2)
Sodium: 142 mmol/L (ref 134–144)

## 2019-09-15 LAB — LIPID PANEL
Chol/HDL Ratio: 4 ratio (ref 0.0–5.0)
Cholesterol, Total: 186 mg/dL (ref 100–199)
HDL: 46 mg/dL (ref >39)
LDL Chol Calc (NIH): 125 mg/dL — ABNORMAL HIGH (ref 0–99)
Triglycerides: 84 mg/dL (ref 0–149)
VLDL Cholesterol Cal: 15 mg/dL (ref 5–40)

## 2019-09-15 LAB — PSA: Prostate Specific Ag, Serum: 1.6 ng/mL (ref 0.0–4.0)

## 2020-06-26 ENCOUNTER — Encounter (HOSPITAL_COMMUNITY): Payer: Self-pay

## 2020-06-26 ENCOUNTER — Emergency Department (HOSPITAL_COMMUNITY): Payer: BC Managed Care – PPO

## 2020-06-26 ENCOUNTER — Emergency Department (HOSPITAL_COMMUNITY)
Admission: EM | Admit: 2020-06-26 | Discharge: 2020-06-26 | Disposition: A | Discharge status: Home / Self Care | Payer: BC Managed Care – PPO | Attending: Emergency Medicine | Admitting: Emergency Medicine

## 2020-06-26 ENCOUNTER — Other Ambulatory Visit: Payer: Self-pay

## 2020-06-26 DIAGNOSIS — K760 Fatty (change of) liver, not elsewhere classified: Secondary | ICD-10-CM | POA: Diagnosis not present

## 2020-06-26 DIAGNOSIS — R109 Unspecified abdominal pain: Secondary | ICD-10-CM | POA: Insufficient documentation

## 2020-06-26 DIAGNOSIS — N281 Cyst of kidney, acquired: Secondary | ICD-10-CM | POA: Diagnosis not present

## 2020-06-26 DIAGNOSIS — K4020 Bilateral inguinal hernia, without obstruction or gangrene, not specified as recurrent: Secondary | ICD-10-CM | POA: Diagnosis not present

## 2020-06-26 DIAGNOSIS — R110 Nausea: Secondary | ICD-10-CM | POA: Diagnosis not present

## 2020-06-26 LAB — COMPREHENSIVE METABOLIC PANEL
ALT: 21 U/L (ref 0–44)
AST: 21 U/L (ref 15–41)
Albumin: 4.2 g/dL (ref 3.5–5.0)
Alkaline Phosphatase: 80 U/L (ref 38–126)
Anion gap: 7 (ref 5–15)
BUN: 16 mg/dL (ref 6–20)
CO2: 27 mmol/L (ref 22–32)
Calcium: 9.2 mg/dL (ref 8.9–10.3)
Chloride: 105 mmol/L (ref 98–111)
Creatinine, Ser: 0.84 mg/dL (ref 0.61–1.24)
GFR, Estimated: >60 mL/min (ref >60)
Glucose, Bld: 86 mg/dL (ref 70–99)
Potassium: 4.2 mmol/L (ref 3.5–5.1)
Sodium: 139 mmol/L (ref 135–145)
Total Bilirubin: 0.5 mg/dL (ref 0.3–1.2)
Total Protein: 6.9 g/dL (ref 6.5–8.1)

## 2020-06-26 LAB — URINALYSIS, ROUTINE W REFLEX MICROSCOPIC
Bilirubin Urine: NEGATIVE
Glucose, UA: NEGATIVE mg/dL
Hgb urine dipstick: NEGATIVE
Ketones, ur: NEGATIVE mg/dL
Leukocytes,Ua: NEGATIVE
Nitrite: NEGATIVE
Protein, ur: NEGATIVE mg/dL
Specific Gravity, Urine: 1.016 (ref 1.005–1.030)
pH: 5 (ref 5.0–8.0)

## 2020-06-26 LAB — CBC WITH DIFFERENTIAL/PLATELET
Abs Immature Granulocytes: 0.04 10*3/uL (ref 0.00–0.07)
Basophils Absolute: 0 10*3/uL (ref 0.0–0.1)
Basophils Relative: 1 %
Eosinophils Absolute: 0.2 10*3/uL (ref 0.0–0.5)
Eosinophils Relative: 3 %
HCT: 47.4 % (ref 39.0–52.0)
Hemoglobin: 15.1 g/dL (ref 13.0–17.0)
Immature Granulocytes: 1 %
Lymphocytes Relative: 25 %
Lymphs Abs: 1.6 10*3/uL (ref 0.7–4.0)
MCH: 28.5 pg (ref 26.0–34.0)
MCHC: 31.9 g/dL (ref 30.0–36.0)
MCV: 89.6 fL (ref 80.0–100.0)
Monocytes Absolute: 0.7 10*3/uL (ref 0.1–1.0)
Monocytes Relative: 11 %
Neutro Abs: 3.8 10*3/uL (ref 1.7–7.7)
Neutrophils Relative %: 59 %
Platelets: 132 10*3/uL — ABNORMAL LOW (ref 150–400)
RBC: 5.29 MIL/uL (ref 4.22–5.81)
RDW: 13.2 % (ref 11.5–15.5)
WBC: 6.4 10*3/uL (ref 4.0–10.5)
nRBC: 0 % (ref 0.0–0.2)

## 2020-06-26 LAB — LIPASE, BLOOD: Lipase: 30 U/L (ref 11–51)

## 2020-06-26 MED ORDER — IOHEXOL 300 MG/ML  SOLN
100.0000 mL | Freq: Once | INTRAMUSCULAR | Status: AC | PRN
  Administered 2020-06-26: 100 mL via INTRAVENOUS

## 2020-06-26 NOTE — ED Provider Notes (Signed)
[facility] EMERGENCY DEPARTMENT Provider Note   CSN: [id number] Arrival date & time: 06/26/20  6301     History Chief Complaint  Patient presents with  . Flank Pain    Richard Werner is a 55 y.o. male.  Patient with a complaint of right CVA right flank pain.'s been ongoing for about 3 weeks but got significantly worse this morning at about 5 in the morning.  About 5 to 7 years ago did have a kidney stone.  Patient states this is somewhat similar but that pain was more sudden onset and more severe.  Patient denies any vomiting but associated with some nausea.  Denies any blood in the urine.        Past Medical History:  Diagnosis Date  . History of kidney stones     Patient Active Problem List   Diagnosis Date Noted  . Special screening for malignant neoplasms, colon 01/07/2016  . Frontal sinusitis 07/13/2012    Past Surgical History:  Procedure Laterality Date  . COLONOSCOPY N/A 04/29/2016   Procedure: COLONOSCOPY;  Surgeon: Malissa Hippo, MD;  Location: [facility];  Service: Endoscopy;  Laterality: N/A;  930       Family History  Problem Relation Age of Onset  . Diabetes Mother   . Heart Problems Mother   . Coronary artery disease Father   . Hypertension Father     Social History   Tobacco Use  . Smoking status: Never Smoker  . Smokeless tobacco: Never Used  Substance Use Topics  . Alcohol use: Yes    Comment: 2-3 drinks per weekend  . Drug use: No    Home Medications Prior to Admission medications   Not on File    Allergies    Patient has no known allergies.  Review of Systems   Review of Systems  Constitutional: Negative for chills and fever.  HENT: Negative for congestion, rhinorrhea and sore throat.   Eyes: Negative for visual disturbance.  Respiratory: Negative for cough and shortness of breath.   Cardiovascular: Negative for chest pain and leg swelling.  Gastrointestinal: Negative for abdominal pain, diarrhea, nausea and vomiting.   Genitourinary: Positive for flank pain. Negative for dysuria.  Musculoskeletal: Positive for back pain. Negative for neck pain.  Skin: Negative for rash.  Neurological: Negative for dizziness, light-headedness and headaches.  Hematological: Does not bruise/bleed easily.  Psychiatric/Behavioral: Negative for confusion.    Physical Exam Updated Vital Signs BP (!) 136/101 (BP Location: Right Arm)   Pulse (!) 58   Temp 98 F (36.7 C) (Oral)   Resp 18   Ht 1.727 m (5\' 8" )   Wt 93 kg   SpO2 100%   BMI 31.17 kg/m   Physical Exam Vitals and nursing note reviewed.  Constitutional:      Appearance: Normal appearance. He is well-developed and well-nourished.  HENT:     Head: Normocephalic and atraumatic.  Eyes:     Extraocular Movements: Extraocular movements intact.     Conjunctiva/sclera: Conjunctivae normal.     Pupils: Pupils are equal, round, and reactive to light.  Cardiovascular:     Rate and Rhythm: Normal rate and regular rhythm.     Heart sounds: No murmur heard.   Pulmonary:     Effort: Pulmonary effort is normal. No respiratory distress.     Breath sounds: Normal breath sounds.  Abdominal:     Palpations: Abdomen is soft.     Tenderness: There is no abdominal tenderness.  Musculoskeletal:  General: No tenderness or edema. Normal range of motion.     Cervical back: Normal range of motion and neck supple.  Skin:    General: Skin is warm and dry.     Capillary Refill: Capillary refill takes less than 2 seconds.  Neurological:     General: No focal deficit present.     Mental Status: He is alert and oriented to person, place, and time.     Cranial Nerves: No cranial nerve deficit.     Sensory: No sensory deficit.     Motor: No weakness.  Psychiatric:        Mood and Affect: Mood and affect normal.     ED Results / Procedures / Treatments   Labs (all labs ordered are listed, but only abnormal results are displayed) Labs Reviewed  CBC WITH  DIFFERENTIAL/PLATELET - Abnormal; Notable for the following components:      Result Value   Platelets 132 (*)    All other components within normal limits  URINALYSIS, ROUTINE W REFLEX MICROSCOPIC  COMPREHENSIVE METABOLIC PANEL  LIPASE, BLOOD    EKG None  Radiology CT Abdomen Pelvis W Contrast  Result Date: 06/26/2020 CLINICAL DATA:  Right flank pain and nausea for 3 weeks, worse this morning. EXAM: CT ABDOMEN AND PELVIS WITH CONTRAST TECHNIQUE: Multidetector CT imaging of the abdomen and pelvis was performed using the standard protocol following bolus administration of intravenous contrast. CONTRAST:  100 mL OMNIPAQUE IOHEXOL 300 MG/ML  SOLN COMPARISON:  None. FINDINGS: Lower chest: Lung bases clear.  No pleural or pericardial effusion. Hepatobiliary: No focal liver abnormality is seen. No gallstones, gallbladder wall thickening, or biliary dilatation. The liver is somewhat low attenuating consistent with fatty infiltration. Pancreas: Unremarkable. No pancreatic ductal dilatation or surrounding inflammatory changes. Spleen: Normal in size without focal abnormality. Adrenals/Urinary Tract: Adrenal glands are unremarkable. Kidneys are normal, without renal calculi, worrisome lesion, or hydronephrosis. 1.2 cm cyst right kidney incidentally noted. Bladder is unremarkable. Stomach/Bowel: Stomach is within normal limits. Appendix appears normal. No evidence of bowel wall thickening, distention, or inflammatory changes. Vascular/Lymphatic: No significant vascular findings are present. No enlarged abdominal or pelvic lymph nodes. Reproductive: Prostate is unremarkable. Other: The patient has bilateral fat containing inguinal hernias. Musculoskeletal: Negative. IMPRESSION: No acute abnormality abdomen or pelvis. No finding to explain the patient's symptoms. Mild fatty infiltration of the liver. Fat containing bilateral inguinal hernias. Electronically Signed   By: Drusilla Kanner M.D.   On: 06/26/2020 10:01     Procedures Procedures   Medications Ordered in ED Medications  iohexol (OMNIPAQUE) 300 MG/ML solution 100 mL (100 mLs Intravenous Contrast Given 06/26/20 0263)    ED Course  I have reviewed the triage vital signs and the nursing notes.  Pertinent labs & imaging results that were available during my care of the patient were reviewed by me and considered in my medical decision making (see chart for details).    MDM Rules/Calculators/A&P                         Work-up for the right flank pain right CVA pain without any acute findings.  There is incidental finding of a renal cyst but this should not be causing the pain.  Think the pain is probably musculoskeletal in nature.  Particularly with the negative CT scan and labs that are very normal.  Urinalysis also normal.  No evidence for urinary tract infection or hematuria.  No leukocytosis.  Patient treated as  musculoskeletal and follow-up with primary care doctor.  If symptoms persist they may want to consider upper endoscopy or lower endoscopy.  Final Clinical Impression(s) / ED Diagnoses Final diagnoses:  Right flank pain    Rx / DC Orders ED Discharge Orders    None       Vanetta Mulders, MD 06/26/20 1124

## 2020-06-26 NOTE — Discharge Instructions (Addendum)
Work-up for the right flank pain right CVA pain without any acute findings.  Urine is normal labs normal.  CT scan that showed no evidence of kidney stones or any other abnormalities.  Which is reassuring.  But recommend you follow back up with your primary care doctor.  This may very well be a muscular pain.  Take Aleve or Motrin as needed.  Return for any new or worse symptoms.

## 2020-06-26 NOTE — ED Triage Notes (Signed)
Pt presents to ED with complaints of right sided flank pain started few weeks ago, worse this am, nausea this am.

## 2020-06-26 NOTE — ED Notes (Signed)
Pt ambulated to bathroom and provided urine sample. Pt denies pain at this time. Back in bed , with stretcher lower and locked. Call bell in reach.

## 2020-06-27 ENCOUNTER — Telehealth: Payer: Self-pay

## 2020-06-27 NOTE — Telephone Encounter (Signed)
Transition Care Management Unsuccessful Follow-up Telephone Call   Date of discharge and from where:  06/26/2020 from [facility]  Attempts:  1st Attempt  Reason for unsuccessful TCM follow-up call:  Unable to reach patient

## 2020-06-28 NOTE — Telephone Encounter (Signed)
Transition Care Management Follow-up Telephone Call  Date of discharge and from where: 06/26/2020 from [facility]  How have you been since you were released from the hospital? Pt stated that he is feeling great and has no issues or concerns.   Any questions or concerns? No  Items Reviewed:  Did the pt receive and understand the discharge instructions provided? Yes   Medications obtained and verified? Yes   Other? No   Any new allergies since your discharge? No   Dietary orders reviewed? n/a  Do you have support at home? Yes   Functional Questionnaire: (I = Independent and D = Dependent) ADLs: I  Bathing/Dressing- I  Meal Prep- I  Eating- I  Maintaining continence- I  Transferring/Ambulation- I  Managing Meds- I  Follow up appointments reviewed:   PCP Hospital f/u appt confirmed? Yes  Scheduled to see Sherie Don, NP 07/05/2020 @ 03:20pm.  Specialist Hospital f/u appt confirmed? No    Are transportation arrangements needed? No   If their condition worsens, is the pt aware to call PCP or go to the Emergency Dept.? Yes  Was the patient provided with contact information for the PCP's office or ED? Yes  Was to pt encouraged to call back with questions or concerns? Yes

## 2020-07-05 ENCOUNTER — Ambulatory Visit: Payer: BC Managed Care – PPO | Admitting: Nurse Practitioner

## 2020-07-05 ENCOUNTER — Encounter: Payer: Self-pay | Admitting: Nurse Practitioner

## 2020-07-05 ENCOUNTER — Other Ambulatory Visit: Payer: Self-pay

## 2020-07-05 VITALS — BP 160/93 | HR 65 | Temp 97.6°F | Wt 203.4 lb

## 2020-07-05 DIAGNOSIS — R030 Elevated blood-pressure reading, without diagnosis of hypertension: Secondary | ICD-10-CM | POA: Diagnosis not present

## 2020-07-05 DIAGNOSIS — M546 Pain in thoracic spine: Secondary | ICD-10-CM | POA: Diagnosis not present

## 2020-07-05 NOTE — Progress Notes (Signed)
   Subjective:    Patient ID: Richard Werner, male    DOB: 02/17/1966, 55 y.o.   MRN: [id number]  HPI Presents for follow-up after ED visit on 3/9.  Pt was worked up for what he thought may have been a kidney stone, he has had one in the past with similar pain, but work-up was negative.  He was splitting wood a few weeks ago when he injured his back, but it had become slightly worse after a night of significant tossing and turning bed so he sought care at the ED.  The pain gradually got better and is now completely gone. States he is not doing any lifting at this time to allow it to rest and heal.  Was using a heating pad on and off which did help, and advil as needed.    Review of Systems  Genitourinary: Negative for difficulty urinating, dysuria, flank pain, frequency, hematuria and urgency.  Musculoskeletal: Negative for back pain.       Objective:   Physical Exam Constitutional:      General: He is not in acute distress. Cardiovascular:     Rate and Rhythm: Normal rate and regular rhythm.     Heart sounds: Normal heart sounds.  Pulmonary:     Effort: Pulmonary effort is normal.     Breath sounds: Normal breath sounds.  Musculoskeletal:     Comments: No tenderness upon palpation of R flank.  Neurological:     Mental Status: He is alert.    Vitals:   07/05/20 1520  BP: (!) 160/93  Pulse: 65  Temp: 97.6 F (36.4 C)  SpO2: 99%        Assessment & Plan:  Acute right-sided thoracic back pain  Elevated BP without diagnosis of hypertension  Recommend checking BP outside of office to confirm elevation.  Recommend wellness exam and labs. Patient to schedule appointment.  Patient to follow-up as needed if pain should return.

## 2020-07-05 NOTE — Progress Notes (Signed)
   Subjective:    Patient ID: Richard Werner, male    DOB: 1965/09/08, 55 y.o.   MRN: [id number]  HPI Pt went to [facility] ER on 06/26/20 for right flank pain. Pt believes he had pulled his back. Was having pain in lower back.  Pt was having a hard time turning over in bed. Did take Aleve that did help. Pt was having really bad pain; felt like kidney stone. Pt not having any pain at this time but has been cautious.    Review of Systems     Objective:   Physical Exam        Assessment & Plan:

## 2020-07-06 ENCOUNTER — Encounter: Payer: Self-pay | Admitting: Nurse Practitioner

## 2021-02-20 IMAGING — DX DG FINGER INDEX 2+V*L*
3 series · 3 of 3 positions shown · non-contrast
Comparison: None.

CLINICAL DATA: Crush injury left index finger in a car door today.
Initial encounter.

EXAM:
LEFT INDEX FINGER 2+V

[finger ap]
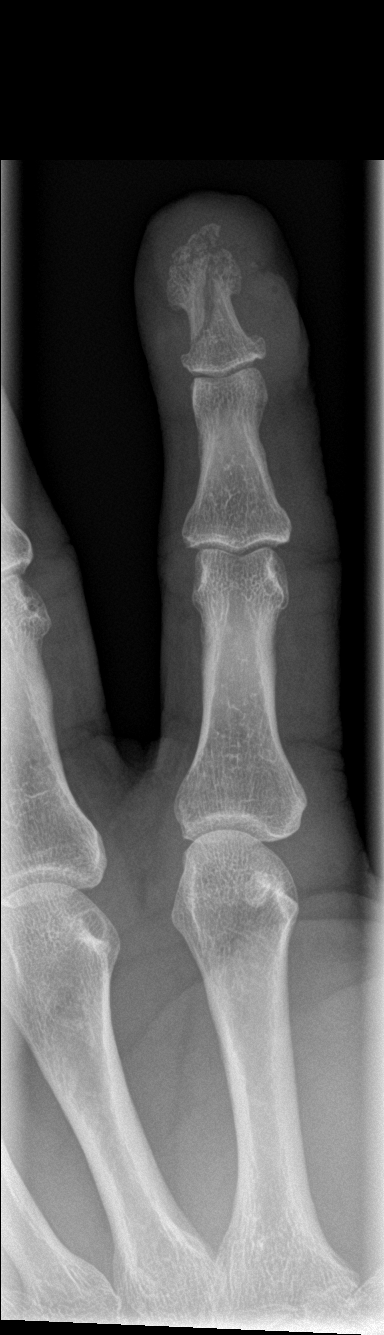

[finger obl]
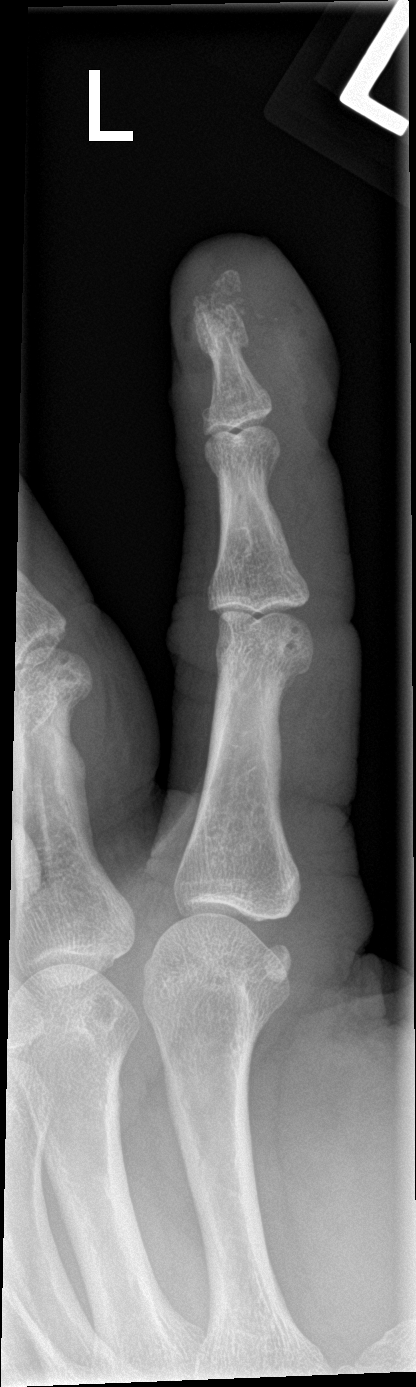

[finger lat]
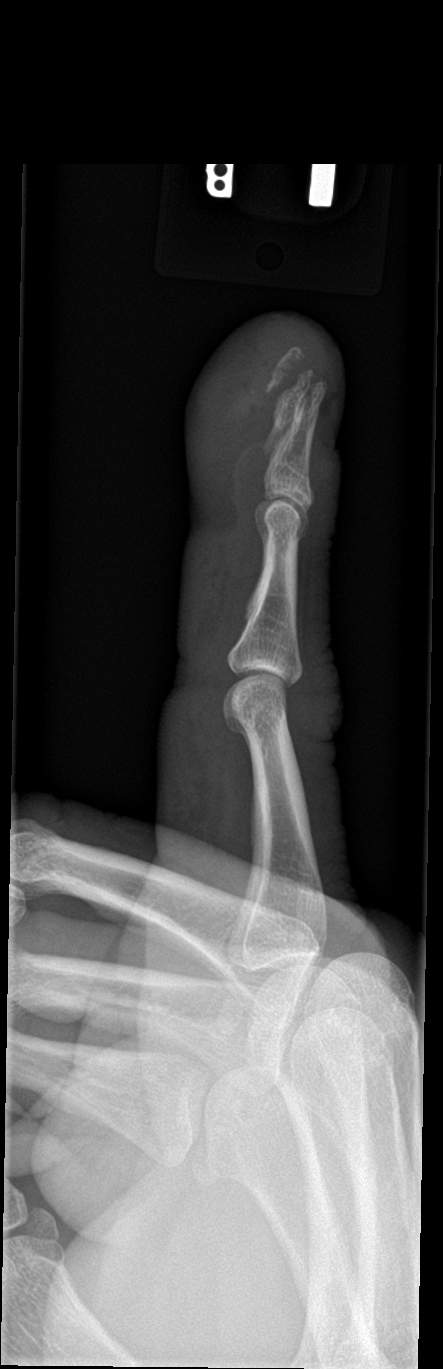

[3 of 3 positions shown; findings below may reference images not displayed]

FINDINGS: Comminuted fracture of the distal phalanx left index finger is
identified. The fracture extends from the proximal metaphysis
through the tuft. Fracture fragment off the tuft is bulky displaced
approximately 0.3 cm. The articular surface of the distal phalanx is
not disrupted. Soft tissue swelling is present about the fracture.
Skin irregularity suggests laceration.
IMPRESSION: Acute fracture distal phalanx left index fingers above.

## 2022-09-10 DIAGNOSIS — L814 Other melanin hyperpigmentation: Secondary | ICD-10-CM | POA: Diagnosis not present

## 2022-09-10 DIAGNOSIS — L821 Other seborrheic keratosis: Secondary | ICD-10-CM | POA: Diagnosis not present
